# Patient Record
Sex: Female | Born: 1940 | Race: Black or African American | Hispanic: No | Smoking: Never smoker
Health system: Southern US, Community
[De-identification: ages and names within clinical notes are randomized; demographics above are authoritative.]

## PROBLEM LIST (undated history)

## (undated) DIAGNOSIS — I2510 Atherosclerotic heart disease of native coronary artery without angina pectoris: Secondary | ICD-10-CM

## (undated) DIAGNOSIS — I10 Essential (primary) hypertension: Secondary | ICD-10-CM

## (undated) HISTORY — PX: EYE SURGERY: SHX253

## (undated) HISTORY — PX: ROTATOR CUFF REPAIR: SHX139

---

## 2014-09-19 ENCOUNTER — Emergency Department (HOSPITAL_BASED_OUTPATIENT_CLINIC_OR_DEPARTMENT_OTHER): Payer: No Typology Code available for payment source

## 2014-09-19 ENCOUNTER — Emergency Department (HOSPITAL_BASED_OUTPATIENT_CLINIC_OR_DEPARTMENT_OTHER)
Admission: EM | Admit: 2014-09-19 | Discharge: 2014-09-20 | Disposition: A | Discharge status: Home / Self Care | Payer: No Typology Code available for payment source | Attending: Emergency Medicine | Admitting: Emergency Medicine

## 2014-09-19 ENCOUNTER — Encounter (HOSPITAL_BASED_OUTPATIENT_CLINIC_OR_DEPARTMENT_OTHER): Payer: Self-pay | Admitting: Emergency Medicine

## 2014-09-19 DIAGNOSIS — Y92410 Unspecified street and highway as the place of occurrence of the external cause: Secondary | ICD-10-CM | POA: Insufficient documentation

## 2014-09-19 DIAGNOSIS — M545 Low back pain, unspecified: Secondary | ICD-10-CM

## 2014-09-19 DIAGNOSIS — Y9389 Activity, other specified: Secondary | ICD-10-CM | POA: Diagnosis not present

## 2014-09-19 DIAGNOSIS — I2510 Atherosclerotic heart disease of native coronary artery without angina pectoris: Secondary | ICD-10-CM | POA: Diagnosis not present

## 2014-09-19 DIAGNOSIS — S29001A Unspecified injury of muscle and tendon of front wall of thorax, initial encounter: Secondary | ICD-10-CM | POA: Diagnosis not present

## 2014-09-19 DIAGNOSIS — S3992XA Unspecified injury of lower back, initial encounter: Secondary | ICD-10-CM | POA: Diagnosis present

## 2014-09-19 DIAGNOSIS — I10 Essential (primary) hypertension: Secondary | ICD-10-CM | POA: Diagnosis not present

## 2014-09-19 DIAGNOSIS — Y998 Other external cause status: Secondary | ICD-10-CM | POA: Insufficient documentation

## 2014-09-19 HISTORY — DX: Essential (primary) hypertension: I10

## 2014-09-19 HISTORY — DX: Atherosclerotic heart disease of native coronary artery without angina pectoris: I25.10
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
## 2014-09-19 NOTE — ED Notes (Signed)
MD at bedside. 
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
## 2014-09-20 MED ORDER — CYCLOBENZAPRINE HCL 5 MG PO TABS: 5.0000 mg | ORAL_TABLET | Freq: Two times a day (BID) | ORAL | Status: AC | PRN

## 2014-09-20 NOTE — ED Notes (Signed)
MD at bedside discussing test results and dispo plan of care. 

## 2014-09-20 NOTE — Discharge Instructions (Signed)
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
## 2018-04-15 DEATH — deceased
